# Patient Record
Sex: Female | Born: 1950 | Race: White | Hispanic: No
Health system: Southern US, Community
[De-identification: ages and names within clinical notes are randomized; demographics above are authoritative.]

---

## 1998-06-26 ENCOUNTER — Emergency Department (HOSPITAL_COMMUNITY): Admission: EM | Admit: 1998-06-26 | Discharge: 1998-06-26 | Payer: Self-pay | Admitting: Emergency Medicine

## 1999-01-05 ENCOUNTER — Other Ambulatory Visit: Admission: RE | Admit: 1999-01-05 | Discharge: 1999-01-05 | Payer: Self-pay | Admitting: *Deleted

## 2000-02-26 ENCOUNTER — Encounter: Admission: RE | Admit: 2000-02-26 | Discharge: 2000-02-26 | Payer: Self-pay | Admitting: *Deleted

## 2001-03-06 ENCOUNTER — Encounter: Admission: RE | Admit: 2001-03-06 | Discharge: 2001-03-06 | Payer: Self-pay | Admitting: *Deleted

## 2001-04-17 ENCOUNTER — Other Ambulatory Visit: Admission: RE | Admit: 2001-04-17 | Discharge: 2001-04-17 | Payer: Self-pay | Admitting: *Deleted

## 2004-07-04 ENCOUNTER — Ambulatory Visit (HOSPITAL_COMMUNITY): Admission: RE | Admit: 2004-07-04 | Discharge: 2004-07-04 | Payer: Self-pay | Admitting: *Deleted

## 2004-08-09 ENCOUNTER — Ambulatory Visit (HOSPITAL_COMMUNITY): Admission: RE | Admit: 2004-08-09 | Discharge: 2004-08-09 | Payer: Self-pay | Admitting: Gastroenterology

## 2006-12-03 ENCOUNTER — Ambulatory Visit (HOSPITAL_COMMUNITY): Admission: RE | Admit: 2006-12-03 | Discharge: 2006-12-03 | Payer: Self-pay | Admitting: *Deleted

## 2007-12-30 ENCOUNTER — Ambulatory Visit (HOSPITAL_COMMUNITY): Admission: RE | Admit: 2007-12-30 | Discharge: 2007-12-30 | Payer: Self-pay | Admitting: *Deleted

## 2010-11-16 ENCOUNTER — Encounter: Admission: RE | Admit: 2010-11-16 | Discharge: 2010-11-16 | Payer: Self-pay | Admitting: Internal Medicine

## 2011-05-18 NOTE — Op Note (Signed)
Joy Parker, Joy Parker                         ACCOUNT NO.:  [id number]   MEDICAL RECORD NO.:  [id number]                   PATIENT TYPE:  AMB   LOCATION:  ENDO                                 FACILITY:  [facility]   PHYSICIAN:  Anselmo Rod, M.D.               DATE OF BIRTH:  09/21/1950   DATE OF PROCEDURE:  08/09/2004  DATE OF DISCHARGE:                                 OPERATIVE REPORT   PROCEDURE:  Screening colonoscopy.   ENDOSCOPIST:  Charna Elizabeth, M.D.   INSTRUMENT USED:  Olympus video colonoscope.   INDICATIONS FOR PROCEDURE:  60 year old white female undergoing screening  colonoscopy.  The patient has a history of migraine headaches and a thyroid  nodule in the past.  She also has a family history of Crohn's disease in her  sister.   PREPROCEDURE PREPARATION:  Informed consent was obtained from the patient.  The patient was fasted for eight hours prior to the procedure and prepped  with a bottle of magnesium citrate and a gallon of GoLYTELY the night prior  to the procedure.   PREPROCEDURE PHYSICAL:  Patient with stable vital signs.  Neck supple.  Chest clear to auscultation.  S1 and S2 regular.  Abdomen soft with normal  bowel sounds.   DESCRIPTION OF PROCEDURE:  The patient was placed in the left lateral  decubitus position, sedated with 75 mg of Demerol and 7 mg Versed in slow  incremental doses.  Once the patient was adequately sedated, maintained on  low flow oxygen and continuous cardiac monitoring, the Olympus video  colonoscope was advanced from the rectum to the cecum.  There was a large  amount of residual stool in the colon, multiple washes were done.  The  patient's position was changed from the left lateral to supine to the right  lateral position to mobilize the stool and visualize the underlying mucosa.  A few scattered diverticula were seen in the sigmoid colon.  No masses,  polyps, erosions, or ulcerations were identified.  The appendiceal orifice  and  ileocecal valve were clearly visualized and photographed.  Retroflexion  of the rectum revealed no abnormalities.  The patient tolerated the  procedure well without complications.   IMPRESSION:  1. Essentially unrevealing colonoscopy except for a few scattered sigmoid     diverticula.  2. A large amount of residual stool in the colon, small lesions could have     been risked.  The patient has been informed about this.  3. No masses or polyps seen.   RECOMMENDATIONS:  1. Continue a high fiber diet with liberal fluid intake, brochures on     diverticulosis and a high fiber diet were handed to the patient by the     hospital nurse.  2. Repeat colonoscopy in the next five years unless the patient develops any     abnormal symptoms in the interim which he should report to the office  immediately if they do occur.  3. Outpatient follow up as need arises in the future.                                               Anselmo Rod, M.D.    JNM/MEDQ  D:  08/10/2004  T:  08/10/2004  Job:  [id number]   cc:   Evelena Peat, M.D.  [address]  [place]  [place] [address]  Fax: [phone]

## 2011-12-05 ENCOUNTER — Other Ambulatory Visit (HOSPITAL_COMMUNITY): Payer: Self-pay | Admitting: Family Medicine

## 2011-12-05 DIAGNOSIS — Z1231 Encounter for screening mammogram for malignant neoplasm of breast: Secondary | ICD-10-CM

## 2011-12-05 DIAGNOSIS — Z13820 Encounter for screening for osteoporosis: Secondary | ICD-10-CM

## 2012-01-10 ENCOUNTER — Ambulatory Visit (HOSPITAL_COMMUNITY)
Admission: RE | Admit: 2012-01-10 | Discharge: 2012-01-10 | Disposition: A | Discharge status: Home / Self Care | Payer: BC Managed Care – PPO | Source: Ambulatory Visit | Attending: Family Medicine | Admitting: Family Medicine

## 2012-01-10 DIAGNOSIS — Z780 Asymptomatic menopausal state: Secondary | ICD-10-CM | POA: Insufficient documentation

## 2012-01-10 DIAGNOSIS — Z1231 Encounter for screening mammogram for malignant neoplasm of breast: Secondary | ICD-10-CM

## 2012-01-10 DIAGNOSIS — Z13820 Encounter for screening for osteoporosis: Secondary | ICD-10-CM | POA: Insufficient documentation

## 2012-12-08 ENCOUNTER — Other Ambulatory Visit (HOSPITAL_COMMUNITY): Payer: Self-pay | Admitting: Family Medicine

## 2012-12-08 DIAGNOSIS — Z1231 Encounter for screening mammogram for malignant neoplasm of breast: Secondary | ICD-10-CM

## 2013-01-09 ENCOUNTER — Ambulatory Visit (HOSPITAL_COMMUNITY)
Admission: RE | Admit: 2013-01-09 | Discharge: 2013-01-09 | Disposition: A | Discharge status: Home / Self Care | Payer: 59 | Source: Ambulatory Visit | Attending: Family Medicine | Admitting: Family Medicine

## 2013-01-09 DIAGNOSIS — Z1231 Encounter for screening mammogram for malignant neoplasm of breast: Secondary | ICD-10-CM

## 2013-12-15 ENCOUNTER — Other Ambulatory Visit (HOSPITAL_COMMUNITY): Payer: Self-pay | Admitting: Family Medicine

## 2013-12-15 DIAGNOSIS — Z1231 Encounter for screening mammogram for malignant neoplasm of breast: Secondary | ICD-10-CM

## 2014-01-18 ENCOUNTER — Ambulatory Visit (HOSPITAL_COMMUNITY)
Admission: RE | Admit: 2014-01-18 | Discharge: 2014-01-18 | Disposition: A | Discharge status: Home / Self Care | Payer: 59 | Source: Ambulatory Visit | Attending: Family Medicine | Admitting: Family Medicine

## 2014-01-18 DIAGNOSIS — Z1231 Encounter for screening mammogram for malignant neoplasm of breast: Secondary | ICD-10-CM

## 2014-12-13 ENCOUNTER — Other Ambulatory Visit (HOSPITAL_COMMUNITY): Payer: Self-pay | Admitting: Family Medicine

## 2014-12-13 DIAGNOSIS — Z1231 Encounter for screening mammogram for malignant neoplasm of breast: Secondary | ICD-10-CM

## 2014-12-28 ENCOUNTER — Ambulatory Visit (HOSPITAL_COMMUNITY)
Admission: RE | Admit: 2014-12-28 | Discharge: 2014-12-28 | Disposition: A | Discharge status: Home / Self Care | Payer: 59 | Source: Ambulatory Visit | Attending: Family Medicine | Admitting: Family Medicine

## 2014-12-28 DIAGNOSIS — Z1231 Encounter for screening mammogram for malignant neoplasm of breast: Secondary | ICD-10-CM | POA: Insufficient documentation

## 2015-12-15 ENCOUNTER — Other Ambulatory Visit: Payer: Self-pay

## 2015-12-15 DIAGNOSIS — Z1231 Encounter for screening mammogram for malignant neoplasm of breast: Secondary | ICD-10-CM

## 2016-01-03 ENCOUNTER — Ambulatory Visit
Admission: RE | Admit: 2016-01-03 | Discharge: 2016-01-03 | Disposition: A | Discharge status: Home / Self Care | Payer: Managed Care, Other (non HMO) | Source: Ambulatory Visit

## 2016-01-03 DIAGNOSIS — Z1231 Encounter for screening mammogram for malignant neoplasm of breast: Secondary | ICD-10-CM

## 2016-12-10 ENCOUNTER — Other Ambulatory Visit: Payer: Self-pay | Admitting: Family Medicine

## 2016-12-10 DIAGNOSIS — Z1231 Encounter for screening mammogram for malignant neoplasm of breast: Secondary | ICD-10-CM

## 2017-01-11 ENCOUNTER — Ambulatory Visit
Admission: RE | Admit: 2017-01-11 | Discharge: 2017-01-11 | Disposition: A | Discharge status: Home / Self Care | Payer: Managed Care, Other (non HMO) | Source: Ambulatory Visit | Attending: Family Medicine | Admitting: Family Medicine

## 2017-01-11 DIAGNOSIS — Z1231 Encounter for screening mammogram for malignant neoplasm of breast: Secondary | ICD-10-CM

## 2017-02-19 ENCOUNTER — Other Ambulatory Visit: Payer: Self-pay | Admitting: Family Medicine

## 2017-02-19 DIAGNOSIS — M8588 Other specified disorders of bone density and structure, other site: Secondary | ICD-10-CM

## 2017-02-25 ENCOUNTER — Other Ambulatory Visit: Payer: Managed Care, Other (non HMO)

## 2017-02-27 ENCOUNTER — Ambulatory Visit
Admission: RE | Admit: 2017-02-27 | Discharge: 2017-02-27 | Disposition: A | Discharge status: Home / Self Care | Payer: Managed Care, Other (non HMO) | Source: Ambulatory Visit | Attending: Family Medicine | Admitting: Family Medicine

## 2017-02-27 DIAGNOSIS — M8588 Other specified disorders of bone density and structure, other site: Secondary | ICD-10-CM

## 2017-12-02 ENCOUNTER — Other Ambulatory Visit: Payer: Self-pay | Admitting: Family Medicine

## 2017-12-02 DIAGNOSIS — Z1231 Encounter for screening mammogram for malignant neoplasm of breast: Secondary | ICD-10-CM

## 2018-01-20 ENCOUNTER — Ambulatory Visit
Admission: RE | Admit: 2018-01-20 | Discharge: 2018-01-20 | Disposition: A | Discharge status: Home / Self Care | Payer: Managed Care, Other (non HMO) | Source: Ambulatory Visit | Attending: Family Medicine | Admitting: Family Medicine

## 2018-01-20 DIAGNOSIS — Z1231 Encounter for screening mammogram for malignant neoplasm of breast: Secondary | ICD-10-CM

## 2018-12-15 ENCOUNTER — Other Ambulatory Visit: Payer: Self-pay | Admitting: Family Medicine

## 2018-12-15 DIAGNOSIS — Z1231 Encounter for screening mammogram for malignant neoplasm of breast: Secondary | ICD-10-CM

## 2019-02-16 ENCOUNTER — Ambulatory Visit
Admission: RE | Admit: 2019-02-16 | Discharge: 2019-02-16 | Disposition: A | Discharge status: Home / Self Care | Payer: Managed Care, Other (non HMO) | Source: Ambulatory Visit | Attending: Family Medicine | Admitting: Family Medicine

## 2019-02-16 DIAGNOSIS — Z1231 Encounter for screening mammogram for malignant neoplasm of breast: Secondary | ICD-10-CM

## 2019-02-27 ENCOUNTER — Other Ambulatory Visit: Payer: Self-pay | Admitting: Physician Assistant

## 2019-02-27 DIAGNOSIS — E2839 Other primary ovarian failure: Secondary | ICD-10-CM

## 2019-05-05 ENCOUNTER — Other Ambulatory Visit: Payer: Managed Care, Other (non HMO)

## 2019-07-23 ENCOUNTER — Other Ambulatory Visit: Payer: Managed Care, Other (non HMO)

## 2019-08-31 ENCOUNTER — Other Ambulatory Visit: Payer: Managed Care, Other (non HMO)

## 2019-11-03 ENCOUNTER — Other Ambulatory Visit: Payer: Self-pay

## 2019-11-03 ENCOUNTER — Ambulatory Visit
Admission: RE | Admit: 2019-11-03 | Discharge: 2019-11-03 | Disposition: A | Discharge status: Home / Self Care | Payer: Managed Care, Other (non HMO) | Source: Ambulatory Visit | Attending: Physician Assistant | Admitting: Physician Assistant

## 2019-11-03 DIAGNOSIS — E2839 Other primary ovarian failure: Secondary | ICD-10-CM

## 2020-01-11 ENCOUNTER — Other Ambulatory Visit: Payer: Self-pay | Admitting: Family Medicine

## 2020-01-11 DIAGNOSIS — Z1231 Encounter for screening mammogram for malignant neoplasm of breast: Secondary | ICD-10-CM

## 2020-02-14 ENCOUNTER — Ambulatory Visit: Payer: Self-pay

## 2020-02-22 ENCOUNTER — Ambulatory Visit
Admission: RE | Admit: 2020-02-22 | Discharge: 2020-02-22 | Disposition: A | Discharge status: Home / Self Care | Payer: Managed Care, Other (non HMO) | Source: Ambulatory Visit | Attending: Family Medicine | Admitting: Family Medicine

## 2020-02-22 ENCOUNTER — Other Ambulatory Visit: Payer: Self-pay

## 2020-02-22 DIAGNOSIS — Z1231 Encounter for screening mammogram for malignant neoplasm of breast: Secondary | ICD-10-CM

## 2020-02-25 ENCOUNTER — Ambulatory Visit: Payer: Managed Care, Other (non HMO) | Attending: Internal Medicine

## 2020-02-25 DIAGNOSIS — Z23 Encounter for immunization: Secondary | ICD-10-CM | POA: Insufficient documentation

## 2020-02-25 NOTE — Progress Notes (Signed)
   Covid-19 Vaccination Clinic  Name:  Joy Parker    MRN: [id number] DOB: [birth year]  02/25/2020  [name] was observed post Covid-19 immunization for 15 minutes without incidence. She was provided with Vaccine Information Sheet and instruction to access the V-Safe system.   [name] was instructed to call 911 with any severe reactions post vaccine: Marland Kitchen Difficulty breathing  . Swelling of your face and throat  . A fast heartbeat  . A bad rash all over your body  . Dizziness and weakness    Immunizations Administered    Name Date Dose VIS Date Route   Pfizer COVID-19 Vaccine 02/25/2020  4:47 PM 0.3 mL 12/11/2019 Intramuscular   Manufacturer: [employer], [employer]   Lot: [id number]   NDC: [address]-5

## 2020-03-23 ENCOUNTER — Ambulatory Visit: Payer: Managed Care, Other (non HMO) | Attending: Internal Medicine

## 2020-03-23 DIAGNOSIS — Z23 Encounter for immunization: Secondary | ICD-10-CM

## 2020-03-23 NOTE — Progress Notes (Signed)
   Covid-19 Vaccination Clinic  Name:  Joy Parker    MRN: [id number] DOB: [birth year]  03/23/2020  Joy Parker was observed post Covid-19 immunization for 15 minutes without incident. She was provided with Vaccine Information Sheet and instruction to access the V-Safe system.   Joy Parker was instructed to call 911 with any severe reactions post vaccine: Marland Kitchen Difficulty breathing  . Swelling of face and throat  . A fast heartbeat  . A bad rash all over body  . Dizziness and weakness   Immunizations Administered    Name Date Dose VIS Date Route   Pfizer COVID-19 Vaccine 03/23/2020  4:25 PM 0.3 mL 12/11/2019 Intramuscular   Manufacturer: [employer], [employer]   Lot: [id number]   NDC: [address]-7

## 2020-03-28 ENCOUNTER — Other Ambulatory Visit: Payer: Self-pay | Admitting: Family Medicine

## 2020-03-28 DIAGNOSIS — E010 Iodine-deficiency related diffuse (endemic) goiter: Secondary | ICD-10-CM

## 2020-03-30 ENCOUNTER — Ambulatory Visit
Admission: RE | Admit: 2020-03-30 | Discharge: 2020-03-30 | Disposition: A | Discharge status: Home / Self Care | Payer: Managed Care, Other (non HMO) | Source: Ambulatory Visit | Attending: Family Medicine | Admitting: Family Medicine

## 2020-03-30 DIAGNOSIS — E010 Iodine-deficiency related diffuse (endemic) goiter: Secondary | ICD-10-CM

## 2020-03-30 IMAGING — US US THYROID
1 series · 13 of 25 positions shown · non-contrast
Comparison: None.

CLINICAL DATA: Thyromegaly. Status post prior right-sided
thyroidectomy.

EXAM:
THYROID ULTRASOUND
TECHNIQUE: Ultrasound examination of the thyroid gland and adjacent soft
tissues was performed.

[Series 1: us thyroid · 0.07mm/px · 13 of 67 slices shown]
[im 1/67]
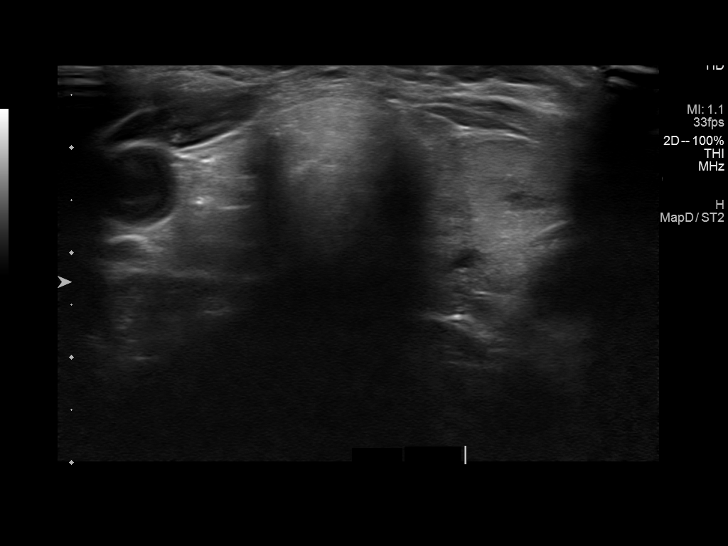
[im 6/67]
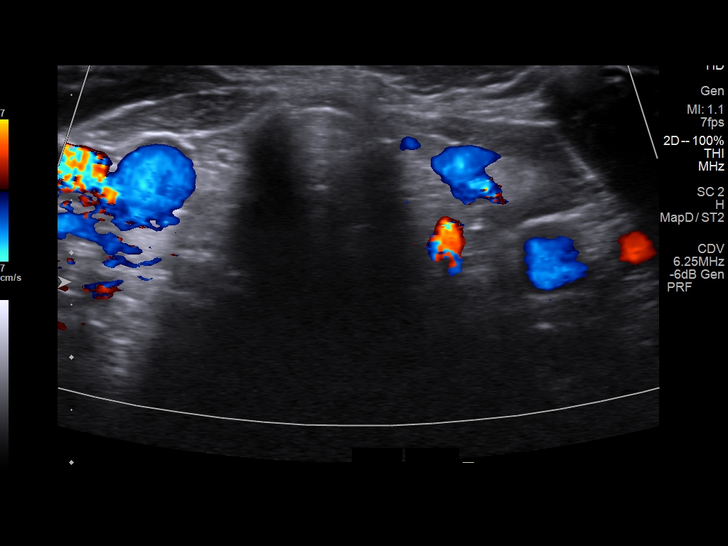
[im 12/67]
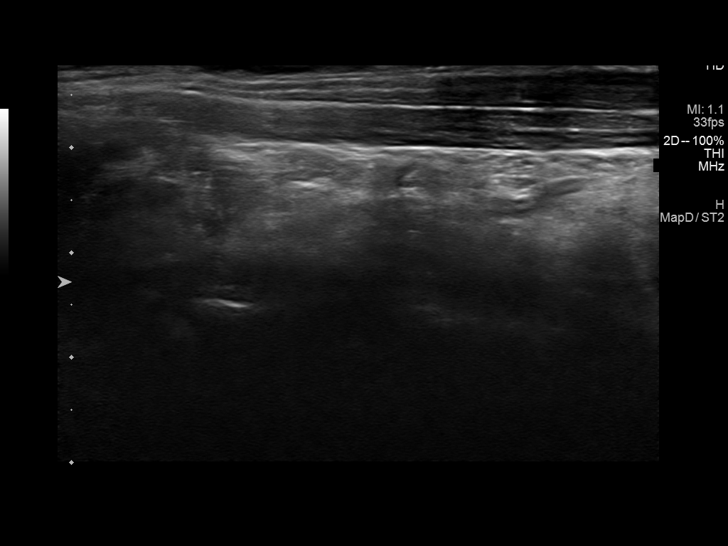
[im 17/67]
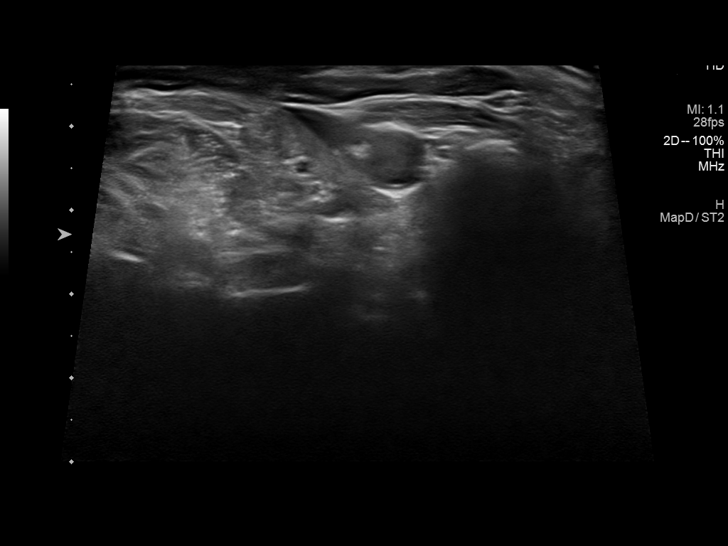
[im 23/67]
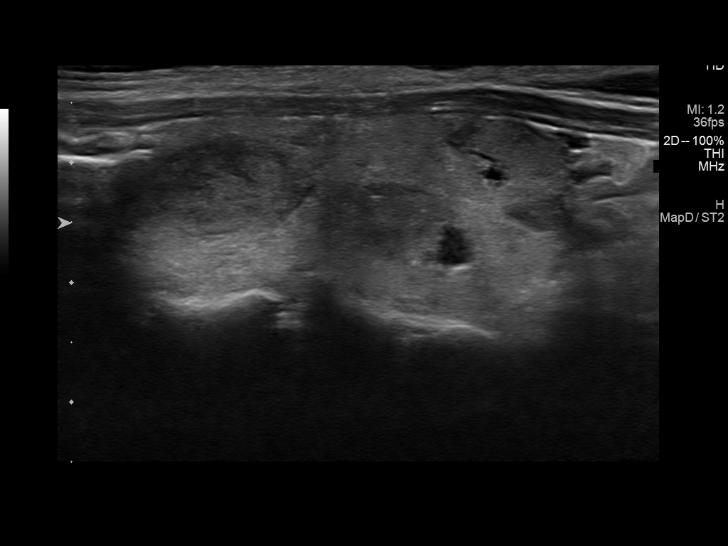
[im 28/67]
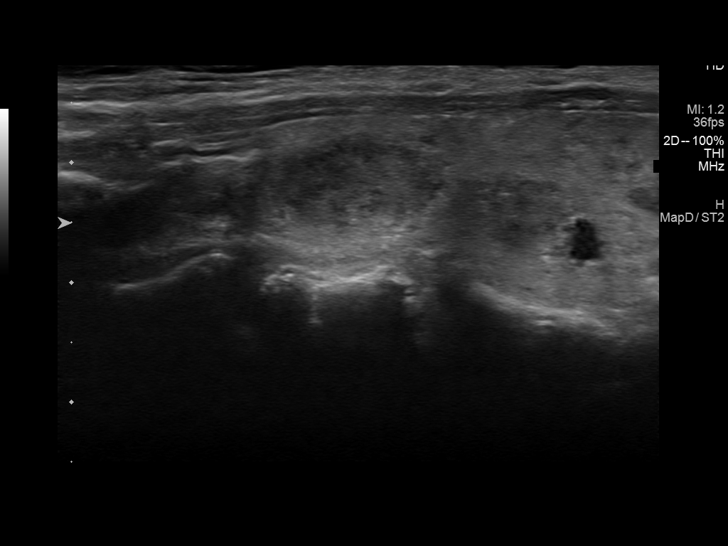
[im 34/67]
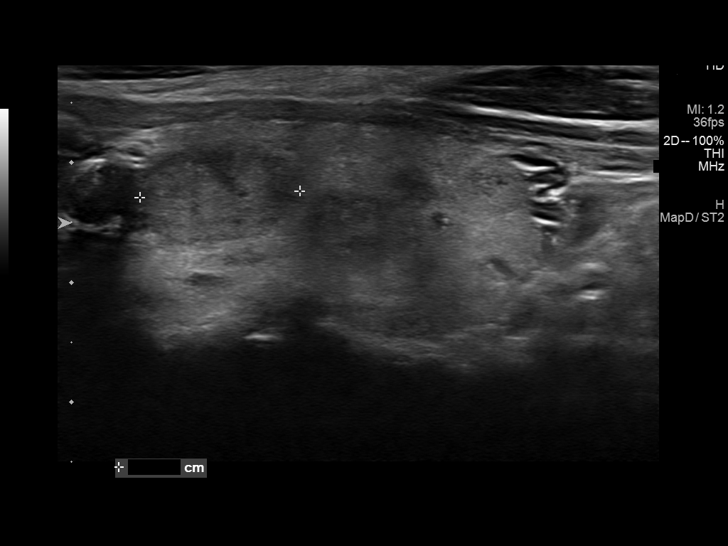
[im 39/67]
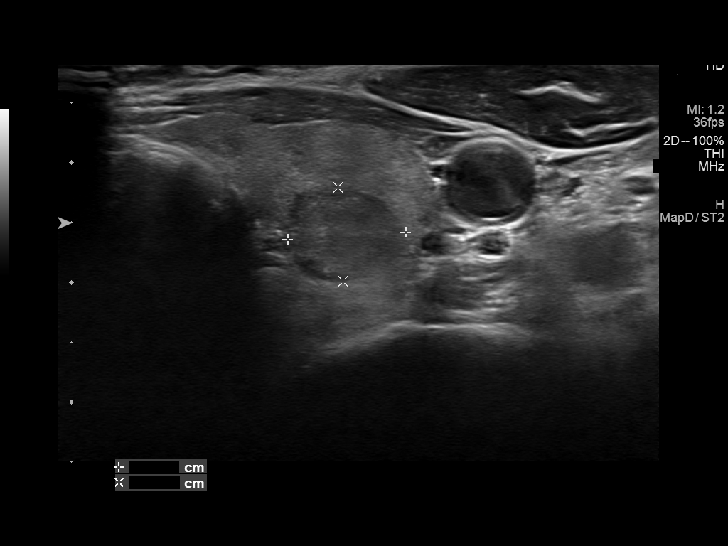
[im 45/67]
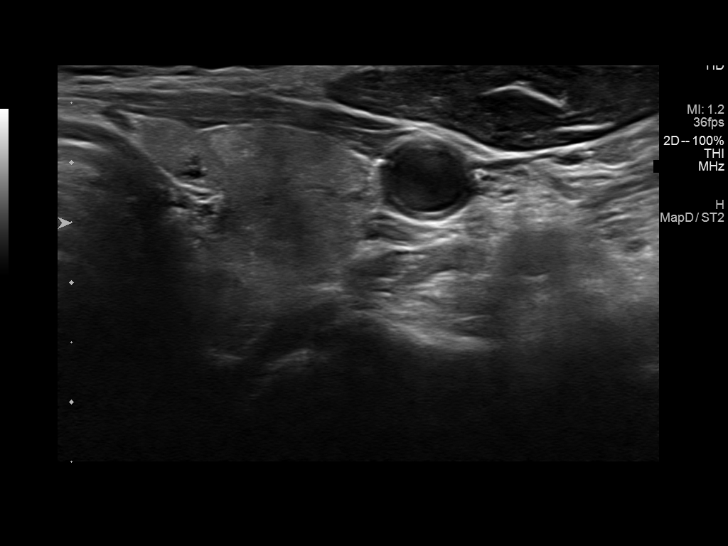
[im 50/67]
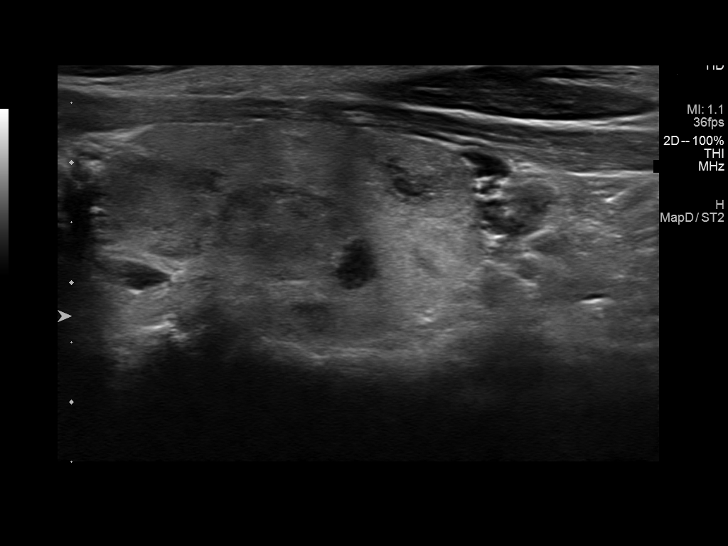
[im 56/67]
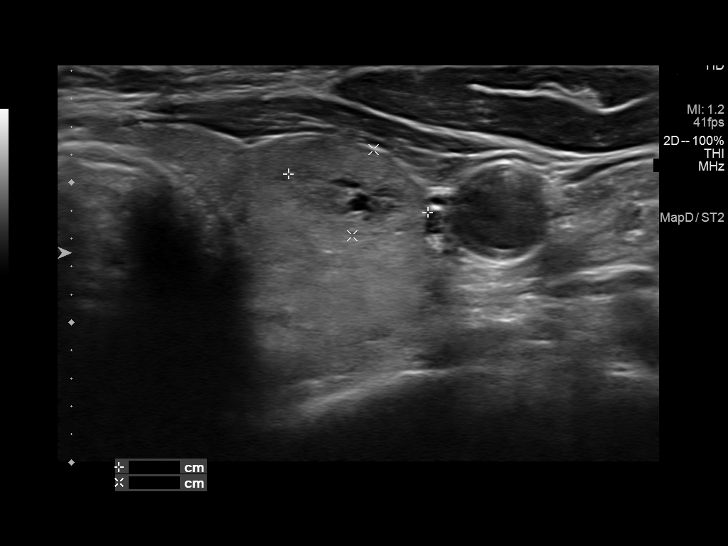
[im 61/67]
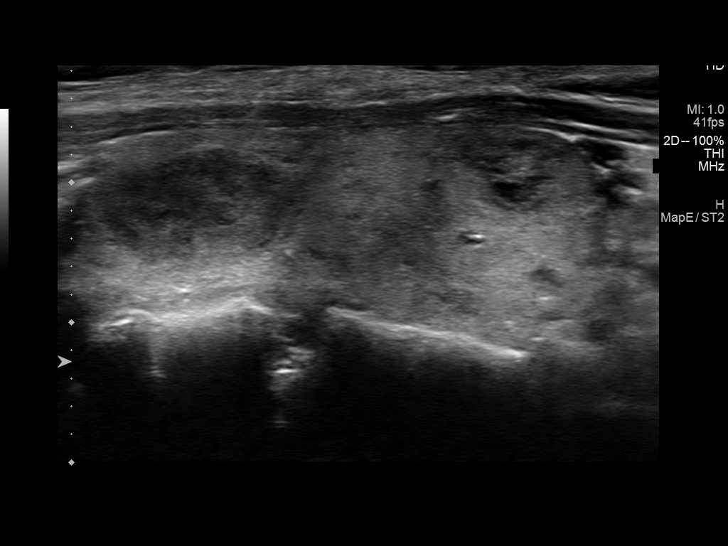
[im 67/67]
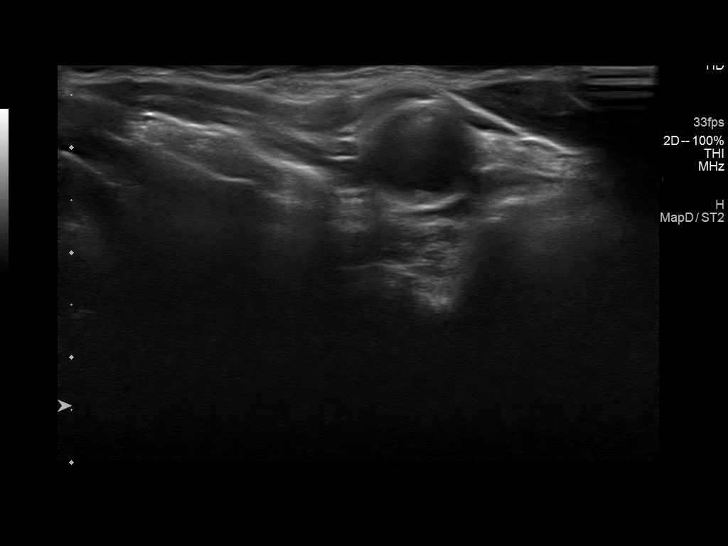

[13 of 25 positions shown; findings below may reference images not displayed]

FINDINGS: Parenchymal Echotexture: Markedly heterogenous

Isthmus: 0.2 cm

Right lobe: Surgically absent

Left lobe: 3.9 x 1.7 x 1.6 cm

_________________________________________________________

Estimated total number of nodules >/= 1 cm: 3

Number of spongiform nodules >/=  2 cm not described below (TR1): 0

Number of mixed cystic and solid nodules >/= 1.5 cm not described
below (TR2): 0

_________________________________________________________

Nodule # 1:

Location: Left; Superior

Maximum size: 1.3 cm; Other 2 dimensions: 1.1 x 1 cm

Composition: solid/almost completely solid (2)

Echogenicity: hypoechoic (2)

Shape: not taller-than-wide (0)

Margins: ill-defined (0)

Echogenic foci: none (0)

ACR TI-RADS total points: 4.

ACR TI-RADS risk category: TR4 (4-6 points).

ACR TI-RADS recommendations:

*Given size (>/= 1 - 1.4 cm) and appearance, a follow-up ultrasound
in 1 year should be considered based on TI-RADS criteria.

_________________________________________________________

Nodule # 2:

Location: Left; Mid

Maximum size: 1.1 cm; Other 2 dimensions: 1 x 0.8 cm

Composition: solid/almost completely solid (2)

Echogenicity: hypoechoic (2)

Shape: not taller-than-wide (0)

Margins: smooth (0)

Echogenic foci: none (0)

ACR TI-RADS total points: 4.

ACR TI-RADS risk category: TR4 (4-6 points).

ACR TI-RADS recommendations:

*Given size (>/= 1 - 1.4 cm) and appearance, a follow-up ultrasound
in 1 year should be considered based on TI-RADS criteria.

_________________________________________________________

There is a 1.2 cm isoechoic mixed cystic and solid nodule in the
lower pole the left thyroid gland. No further follow-up is required
for this finding. There is an additional 0.6 cm hypoechoic thyroid
nodule in the left mid thyroid gland that does not meet criteria for
FNA or follow-up.
IMPRESSION: 1. Status post right-sided thyroidectomy. No evidence for residual
thyroid tissue within the right thyroidectomy bed.
2. There are 2 left-sided thyroid nodules labeled #1 and #2, both of
which meet criteria for a 1 year follow-up ultrasound.
3. There are additional small left-sided thyroid nodules that do not
meet criteria for fine-needle aspiration or follow-up as detailed
above.

The above is in keeping with the ACR TI-RADS recommendations - [HOSPITAL] [L4];[DATE].

## 2021-01-17 ENCOUNTER — Other Ambulatory Visit: Payer: Self-pay | Admitting: Family Medicine

## 2021-01-17 DIAGNOSIS — Z1231 Encounter for screening mammogram for malignant neoplasm of breast: Secondary | ICD-10-CM

## 2021-02-28 ENCOUNTER — Ambulatory Visit: Payer: Managed Care, Other (non HMO)

## 2021-03-21 ENCOUNTER — Ambulatory Visit
Admission: RE | Admit: 2021-03-21 | Discharge: 2021-03-21 | Disposition: A | Discharge status: Home / Self Care | Payer: Managed Care, Other (non HMO) | Source: Ambulatory Visit | Attending: Family Medicine | Admitting: Family Medicine

## 2021-03-21 ENCOUNTER — Other Ambulatory Visit: Payer: Self-pay

## 2021-03-21 DIAGNOSIS — Z1231 Encounter for screening mammogram for malignant neoplasm of breast: Secondary | ICD-10-CM

## 2022-02-12 ENCOUNTER — Other Ambulatory Visit: Payer: Self-pay | Admitting: Family Medicine

## 2022-02-12 DIAGNOSIS — Z1231 Encounter for screening mammogram for malignant neoplasm of breast: Secondary | ICD-10-CM

## 2022-03-22 ENCOUNTER — Ambulatory Visit: Payer: Managed Care, Other (non HMO)

## 2022-03-23 ENCOUNTER — Ambulatory Visit
Admission: RE | Admit: 2022-03-23 | Discharge: 2022-03-23 | Disposition: A | Discharge status: Home / Self Care | Payer: Managed Care, Other (non HMO) | Source: Ambulatory Visit | Attending: Family Medicine | Admitting: Family Medicine

## 2022-03-23 DIAGNOSIS — Z1231 Encounter for screening mammogram for malignant neoplasm of breast: Secondary | ICD-10-CM

## 2022-05-29 ENCOUNTER — Other Ambulatory Visit: Payer: Self-pay | Admitting: Family Medicine

## 2022-05-29 DIAGNOSIS — E2839 Other primary ovarian failure: Secondary | ICD-10-CM

## 2022-07-17 ENCOUNTER — Ambulatory Visit
Admission: RE | Admit: 2022-07-17 | Discharge: 2022-07-17 | Disposition: A | Discharge status: Home / Self Care | Payer: Managed Care, Other (non HMO) | Source: Ambulatory Visit | Attending: Family Medicine | Admitting: Family Medicine

## 2022-07-17 DIAGNOSIS — E2839 Other primary ovarian failure: Secondary | ICD-10-CM

## 2023-02-14 ENCOUNTER — Encounter: Payer: Self-pay | Admitting: Family Medicine

## 2023-02-18 ENCOUNTER — Other Ambulatory Visit: Payer: Self-pay | Admitting: Family Medicine

## 2023-02-18 DIAGNOSIS — N644 Mastodynia: Secondary | ICD-10-CM

## 2023-04-03 ENCOUNTER — Ambulatory Visit
Admission: RE | Admit: 2023-04-03 | Discharge: 2023-04-03 | Disposition: A | Discharge status: Home / Self Care | Payer: Managed Care, Other (non HMO) | Source: Ambulatory Visit | Attending: Family Medicine | Admitting: Family Medicine

## 2023-04-03 ENCOUNTER — Ambulatory Visit: Admission: RE | Admit: 2023-04-03 | Payer: Managed Care, Other (non HMO) | Source: Ambulatory Visit

## 2023-04-03 DIAGNOSIS — N644 Mastodynia: Secondary | ICD-10-CM

## 2024-02-18 ENCOUNTER — Other Ambulatory Visit: Payer: Self-pay | Admitting: Family Medicine

## 2024-02-18 DIAGNOSIS — Z1231 Encounter for screening mammogram for malignant neoplasm of breast: Secondary | ICD-10-CM

## 2024-04-03 ENCOUNTER — Ambulatory Visit
Admission: RE | Admit: 2024-04-03 | Discharge: 2024-04-03 | Disposition: A | Discharge status: Home / Self Care | Payer: Managed Care, Other (non HMO) | Source: Ambulatory Visit | Attending: Family Medicine | Admitting: Family Medicine

## 2024-04-03 DIAGNOSIS — Z1231 Encounter for screening mammogram for malignant neoplasm of breast: Secondary | ICD-10-CM
